# Patient Record
Sex: Female | Born: 2000 | Hispanic: No | Marital: Single | Smoking: Never smoker
Health system: Southern US, Community
[De-identification: ages and names within clinical notes are randomized; demographics above are authoritative.]

## PROBLEM LIST (undated history)

## (undated) DIAGNOSIS — F32A Depression, unspecified: Secondary | ICD-10-CM

## (undated) DIAGNOSIS — R55 Syncope and collapse: Secondary | ICD-10-CM

## (undated) DIAGNOSIS — F329 Major depressive disorder, single episode, unspecified: Secondary | ICD-10-CM

## (undated) DIAGNOSIS — F419 Anxiety disorder, unspecified: Secondary | ICD-10-CM

## (undated) HISTORY — DX: Depression, unspecified: F32.A

## (undated) HISTORY — DX: Anxiety disorder, unspecified: F41.9

## (undated) HISTORY — DX: Syncope and collapse: R55

---
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
## 2022-01-01 DIAGNOSIS — N946 Dysmenorrhea, unspecified: Secondary | ICD-10-CM | POA: Diagnosis not present

## 2022-01-01 DIAGNOSIS — F411 Generalized anxiety disorder: Secondary | ICD-10-CM | POA: Diagnosis not present

## 2022-01-01 DIAGNOSIS — J209 Acute bronchitis, unspecified: Secondary | ICD-10-CM | POA: Diagnosis not present

## 2022-01-01 DIAGNOSIS — R059 Cough, unspecified: Secondary | ICD-10-CM | POA: Diagnosis not present

## 2022-01-01 DIAGNOSIS — N390 Urinary tract infection, site not specified: Secondary | ICD-10-CM | POA: Diagnosis not present
